# Patient Record
Sex: Male | Born: 2000 | Race: Black or African American | Hispanic: No | Marital: Single | Smoking: Never smoker
Health system: Southern US, Community
[De-identification: ages and names within clinical notes are randomized; demographics above are authoritative.]

## PROBLEM LIST (undated history)

## (undated) DIAGNOSIS — J45909 Unspecified asthma, uncomplicated: Secondary | ICD-10-CM

## (undated) DIAGNOSIS — J302 Other seasonal allergic rhinitis: Secondary | ICD-10-CM

## (undated) DIAGNOSIS — K219 Gastro-esophageal reflux disease without esophagitis: Secondary | ICD-10-CM

---
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
## 2019-02-06 ENCOUNTER — Other Ambulatory Visit: Payer: Self-pay

## 2019-02-06 DIAGNOSIS — Z20822 Contact with and (suspected) exposure to covid-19: Secondary | ICD-10-CM

## 2019-02-08 LAB — NOVEL CORONAVIRUS, NAA: SARS-CoV-2, NAA: NOT DETECTED

## 2019-03-04 ENCOUNTER — Emergency Department (HOSPITAL_COMMUNITY)
Admission: EM | Admit: 2019-03-04 | Discharge: 2019-03-04 | Disposition: A | Discharge status: Home / Self Care | Payer: 59 | Attending: Emergency Medicine | Admitting: Emergency Medicine

## 2019-03-04 ENCOUNTER — Emergency Department (HOSPITAL_COMMUNITY): Payer: 59

## 2019-03-04 ENCOUNTER — Other Ambulatory Visit: Payer: Self-pay

## 2019-03-04 ENCOUNTER — Encounter (HOSPITAL_COMMUNITY): Payer: Self-pay

## 2019-03-04 DIAGNOSIS — W25XXXA Contact with sharp glass, initial encounter: Secondary | ICD-10-CM | POA: Diagnosis not present

## 2019-03-04 DIAGNOSIS — Z23 Encounter for immunization: Secondary | ICD-10-CM | POA: Insufficient documentation

## 2019-03-04 DIAGNOSIS — Y92019 Unspecified place in single-family (private) house as the place of occurrence of the external cause: Secondary | ICD-10-CM | POA: Insufficient documentation

## 2019-03-04 DIAGNOSIS — S91312A Laceration without foreign body, left foot, initial encounter: Secondary | ICD-10-CM

## 2019-03-04 DIAGNOSIS — J45909 Unspecified asthma, uncomplicated: Secondary | ICD-10-CM | POA: Diagnosis not present

## 2019-03-04 DIAGNOSIS — Y9301 Activity, walking, marching and hiking: Secondary | ICD-10-CM | POA: Diagnosis not present

## 2019-03-04 DIAGNOSIS — Y999 Unspecified external cause status: Secondary | ICD-10-CM | POA: Insufficient documentation

## 2019-03-04 MED ORDER — LIDOCAINE-EPINEPHRINE (PF) 2 %-1:200000 IJ SOLN
10.0000 mL | Freq: Once | INTRAMUSCULAR | Status: AC
  Administered 2019-03-04: 10 mL via INTRADERMAL
  Filled 2019-03-04: qty 20

## 2019-03-04 MED ORDER — TETANUS-DIPHTH-ACELL PERTUSSIS 5-2.5-18.5 LF-MCG/0.5 IM SUSP
0.5000 mL | Freq: Once | INTRAMUSCULAR | Status: AC
  Administered 2019-03-04: 0.5 mL via INTRAMUSCULAR
  Filled 2019-03-04: qty 0.5

## 2019-03-04 NOTE — Discharge Instructions (Signed)
1. Medications: Alternate 600 mg of ibuprofen and [phone] mg of Tylenol every 3 hours as needed for pain. Do not exceed 4000 mg of Tylenol daily.  Take ibuprofen with food to avoid upset stomach issues. 2. Treatment: ice for swelling, keep wound clean with warm soap and water and keep bandage dry, do not submerge in water for 24 hours 3. Follow Up: Please return in 14 days to have your stitches/staples removed or sooner if you have concerns.  You may also go to urgent care or your primary care physician.  Return to the emergency department sooner if any concerning signs or symptoms develop such as high fevers, redness, drainage of pus from the wound, or swelling.   WOUND CARE  Keep area clean and dry for 24 hours. Do not remove bandage, if applied.  After 24 hours, remove bandage and wash wound gently with mild soap and warm water. Reapply a new bandage after cleaning wound, if directed.   Continue daily cleansing with soap and water until stitches/staples are removed.  Do not apply any ointments or creams to the wound while stitches/staples are in place, as this may cause delayed healing. Return if you experience any of the following signs of infection: Swelling, redness, pus drainage, streaking, fever >101.0 F  Return if you experience excessive bleeding that does not stop after 15-20 minutes of constant, firm pressure.

## 2019-03-04 NOTE — Progress Notes (Signed)
Orthopedic Tech Progress Note Patient Details:  Tristan Whitaker 04/25/2001 [id number]  Ortho Devices Type of Ortho Device: Postop shoe/boot Ortho Device/Splint Interventions: Adjustment, Application, Ordered   Post Interventions Patient Tolerated: Well Instructions Provided: Care of device, Adjustment of device   Melony Overly T 03/04/2019, 2:04 PM

## 2019-03-04 NOTE — ED Triage Notes (Signed)
Pt endorses stepping on broken glass causing a 2 inch laceration to left foot. Bandage in place, bleeding controlled.

## 2019-03-04 NOTE — ED Provider Notes (Signed)
[facility] EMERGENCY DEPARTMENT Provider Note   CSN: [id number] Arrival date & time: 03/04/19  1020     History   Chief Complaint Chief Complaint  Patient presents with  . Laceration    HPI Tristan Whitaker is a 18 y.o. male with history of GERD, asthma, seasonal allergies presents today for evaluation of acute onset, persistent wound to the lateral aspect of the left foot that occurred just prior to arrival.  He reports that he stepped on a piece of glass from a broken vase.  Notes some dull pain around the wound.  Bleeding controlled.  He is unsure if his tetanus is up-to-date.  Denies any numbness or tingling to the foot.  Pain worsens with attempts to ambulate or apply pressure.  He is not on any blood thinners.  He does not think there is any residual glass in the wound.     The history is provided by the patient.    Past Medical History:  Diagnosis Date  . Acid reflux   . Asthma   . Seasonal allergies     Patient Active Problem List   Diagnosis Date Noted  . Asthma exacerbation 10/15/2012  . Respiratory distress 10/15/2012    History reviewed. No pertinent surgical history.      Home Medications    Prior to Admission medications   Medication Sig Start Date End Date Taking? Authorizing Provider  albuterol (PROVENTIL) (2.5 MG/3ML) 0.083% nebulizer solution Take 2.5 mg by nebulization every 6 (six) hours as needed for wheezing.   Yes [provider]  HYDROcodone-acetaminophen (NORCO/VICODIN) 5-325 MG tablet Take 1-2 tablets by mouth every 6 (six) hours as needed. Patient not taking: Reported on 03/04/2019 06/12/17   Vicki Mallet, MD  ibuprofen (ADVIL,MOTRIN) 400 MG tablet Take 1 tablet (400 mg total) by mouth once. Patient not taking: Reported on 03/04/2019 11/23/15   Earley Favor, NP    Family History Family History  Problem Relation Age of Onset  . Hypertension Father     Social History Social History   Tobacco Use  . Smoking  status: Never Smoker  Substance Use Topics  . Alcohol use: No  . Drug use: No     Allergies   Fish allergy, Milk-related compounds, Peanuts [peanut oil], and Pork-derived products   Review of Systems Review of Systems  Skin: Positive for wound.  Neurological: Negative for weakness and numbness.     Physical Exam Updated Vital Signs BP 138/87 (BP Location: Left Arm)   Pulse 62   Temp 98.8 F (37.1 C) (Oral)   Resp 17   Ht 5\' 10"  (1.778 m)   Wt 52.2 kg   SpO2 99%   BMI 16.50 kg/m   Physical Exam Vitals signs and nursing note reviewed.  Constitutional:      General: He is not in acute distress.    Appearance: He is well-developed.  HENT:     Head: Normocephalic and atraumatic.  Eyes:     General:        Right eye: No discharge.        Left eye: No discharge.     Conjunctiva/sclera: Conjunctivae normal.  Neck:     Vascular: No JVD.     Trachea: No tracheal deviation.  Cardiovascular:     Rate and Rhythm: Normal rate.     Pulses: Normal pulses.     Comments: 2+ DP/PT pulses bilaterally, no lower extremity edema Pulmonary:     Effort: Pulmonary effort is  normal.  Abdominal:     General: There is no distension.  Musculoskeletal:     Comments: 5/5 strength of feet bilaterally with flexion and extension against resistance.  EHL strength intact.  Skin:    General: Skin is warm and dry.     Findings: No erythema.     Comments: 5 cm laceration along the lateral aspect of the left foot extending into the plantar region.  No observable foreign body.  Bleeding controlled.  Neurological:     Mental Status: He is alert.     Comments: Sensation intact to light touch of bilateral lower extremities  Psychiatric:        Behavior: Behavior normal.      ED Treatments / Results  Labs (all labs ordered are listed, but only abnormal results are displayed) Labs Reviewed - No data to display  EKG None  Radiology Dg Foot 2 Views Left  Result Date: 03/04/2019 CLINICAL  DATA:  The patient suffered a laceration of the lateral aspect of the left foot on broken glass this morning. Initial encounter. EXAM: LEFT FOOT - 2 VIEW COMPARISON:  None. FINDINGS: Skin defect along the lateral margin of the base of the fifth metatarsal consistent with laceration is identified. No radiopaque foreign body is seen. No fracture or dislocation. IMPRESSION: Laceration without underlying fracture or foreign body. Electronically Signed   By: Inge Rise M.D.   On: 03/04/2019 11:48    Procedures .Marland KitchenLaceration Repair  Date/Time: 03/04/2019 1:54 PM Performed by: Renita Papa, PA-C Authorized by: Renita Papa, PA-C   Consent:    Consent obtained:  Verbal   Consent given by:  Patient   Risks discussed:  Infection, need for additional repair, pain, poor cosmetic result and poor wound healing   Alternatives discussed:  No treatment and delayed treatment Universal protocol:    Procedure explained and questions answered to patient or proxy's satisfaction: yes     Relevant documents present and verified: yes     Test results available and properly labeled: yes     Imaging studies available: yes     Required blood products, implants, devices, and special equipment available: yes     Site/side marked: yes     Immediately prior to procedure, a time out was called: yes     Patient identity confirmed:  Verbally with patient and arm band Anesthesia (see MAR for exact dosages):    Anesthesia method:  Local infiltration   Local anesthetic:  Lidocaine 2% WITH epi Laceration details:    Location:  Foot   Foot location: lateral Left foot.   Length (cm):  5   Depth (mm):  4 Repair type:    Repair type:  Intermediate Pre-procedure details:    Preparation:  Patient was prepped and draped in usual sterile fashion and imaging obtained to evaluate for foreign bodies Exploration:    Hemostasis achieved with:  Direct pressure   Wound exploration: wound explored through full range of motion      Wound extent: areolar tissue violated     Wound extent: no foreign bodies/material noted, no nerve damage noted and no underlying fracture noted     Contaminated: no   Treatment:    Area cleansed with:  Betadine and saline   Amount of cleaning:  Extensive   Irrigation solution:  Sterile saline   Irrigation method:  Pressure wash   Visualized foreign bodies/material removed: no   Skin repair:    Repair method:  Sutures  Suture size:  4-0   Suture material:  Prolene   Suture technique:  Simple interrupted   Number of sutures:  8 Approximation:    Approximation:  Close Post-procedure details:    Dressing:  Sterile dressing   Patient tolerance of procedure:  Tolerated well, no immediate complications   (including critical care time)  Medications Ordered in ED Medications  lidocaine-EPINEPHrine (XYLOCAINE W/EPI) 2 %-1:200000 (PF) injection 10 mL (10 mLs Intradermal Given by Other 03/04/19 1342)  Tdap (BOOSTRIX) injection 0.5 mL (0.5 mLs Intramuscular Given 03/04/19 1346)     Initial Impression / Assessment and Plan / ED Course  I have reviewed the triage vital signs and the nursing notes.  Pertinent labs & imaging results that were available during my care of the patient were reviewed by me and considered in my medical decision making (see chart for details).        Patient with left foot lack sustained prior to arrival.  Patient is afebrile, vital signs are stable.  He is nontoxic in appearance.  He is neurovascularly intact.  Radiographs show no retained foreign body or osseous abnormality.  Pressure irrigation performed. Wound explored and base of wound visualized in a bloodless field without evidence of foreign body.  Laceration occurred < 8 hours prior to repair which was well tolerated. Tdap updated.  Pt has  no comorbidities to effect normal wound healing. Pt discharged without antibiotics.  Discussed suture home care with patient and answered questions. Pt to follow-up for  wound check and suture removal in 14 days; they are to return to the ED sooner for signs of infection. Pt is hemodynamically stable with no complaints prior to dc.    Final Clinical Impressions(s) / ED Diagnoses   Final diagnoses:  Laceration of left foot, initial encounter    ED Discharge Orders    None       Jeanie SewerFawze, Cortlynn Hollinsworth A, PA-C 03/04/19 1356    Tegeler, Canary Brimhristopher J, MD 03/04/19 [phone]

## 2019-04-18 ENCOUNTER — Other Ambulatory Visit: Payer: Self-pay

## 2019-04-18 DIAGNOSIS — Z20822 Contact with and (suspected) exposure to covid-19: Secondary | ICD-10-CM

## 2019-04-20 LAB — NOVEL CORONAVIRUS, NAA: SARS-CoV-2, NAA: NOT DETECTED

## 2019-07-11 IMAGING — CR DG WRIST COMPLETE 3+V*L*
3 series · 3 of 3 positions shown · non-contrast
Comparison: 12/04/2014

CLINICAL DATA: Left wrist pain and swelling after a fall playing
basketball today. Prior fractures.

EXAM:
LEFT WRIST - COMPLETE 3+ VIEW

[wrist pa]
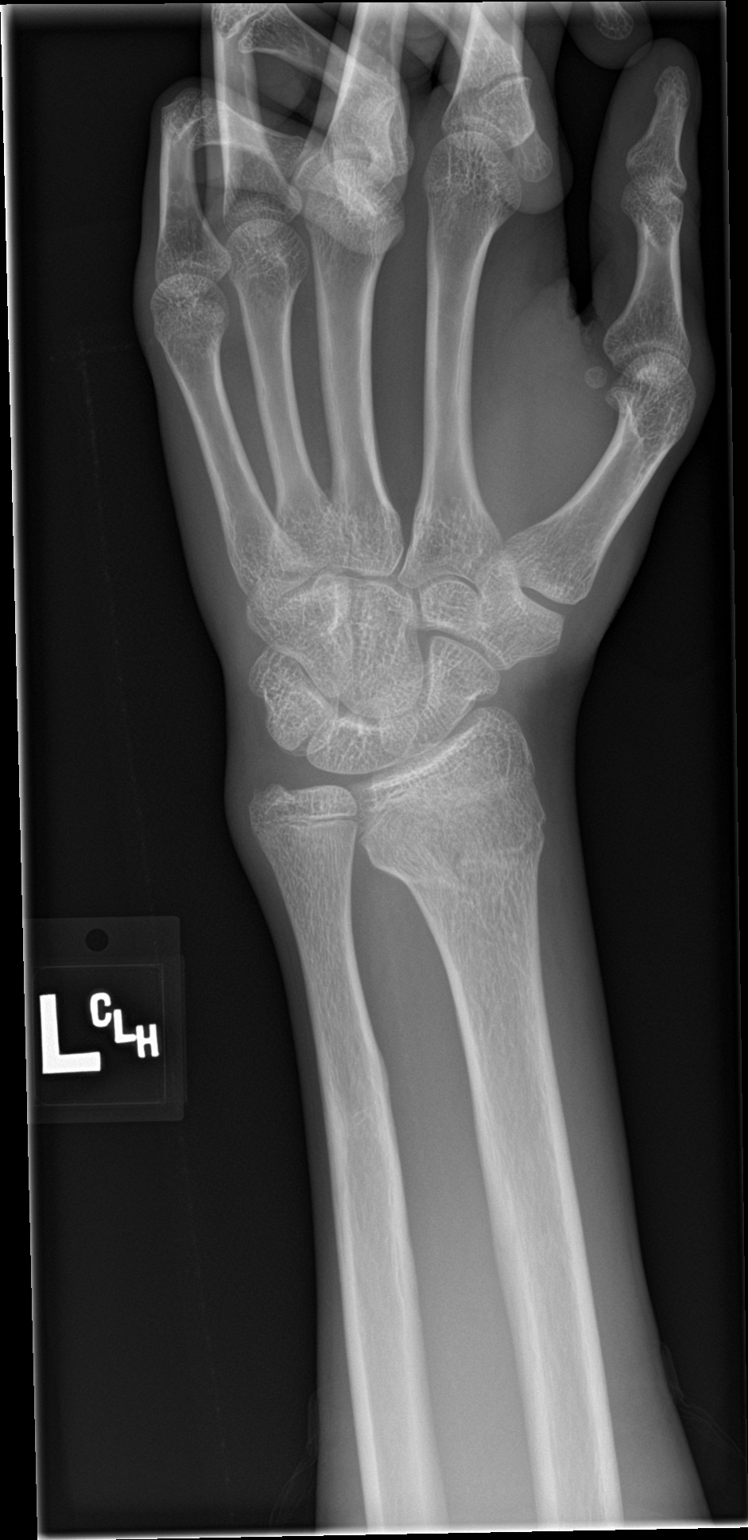

[wrist obl]
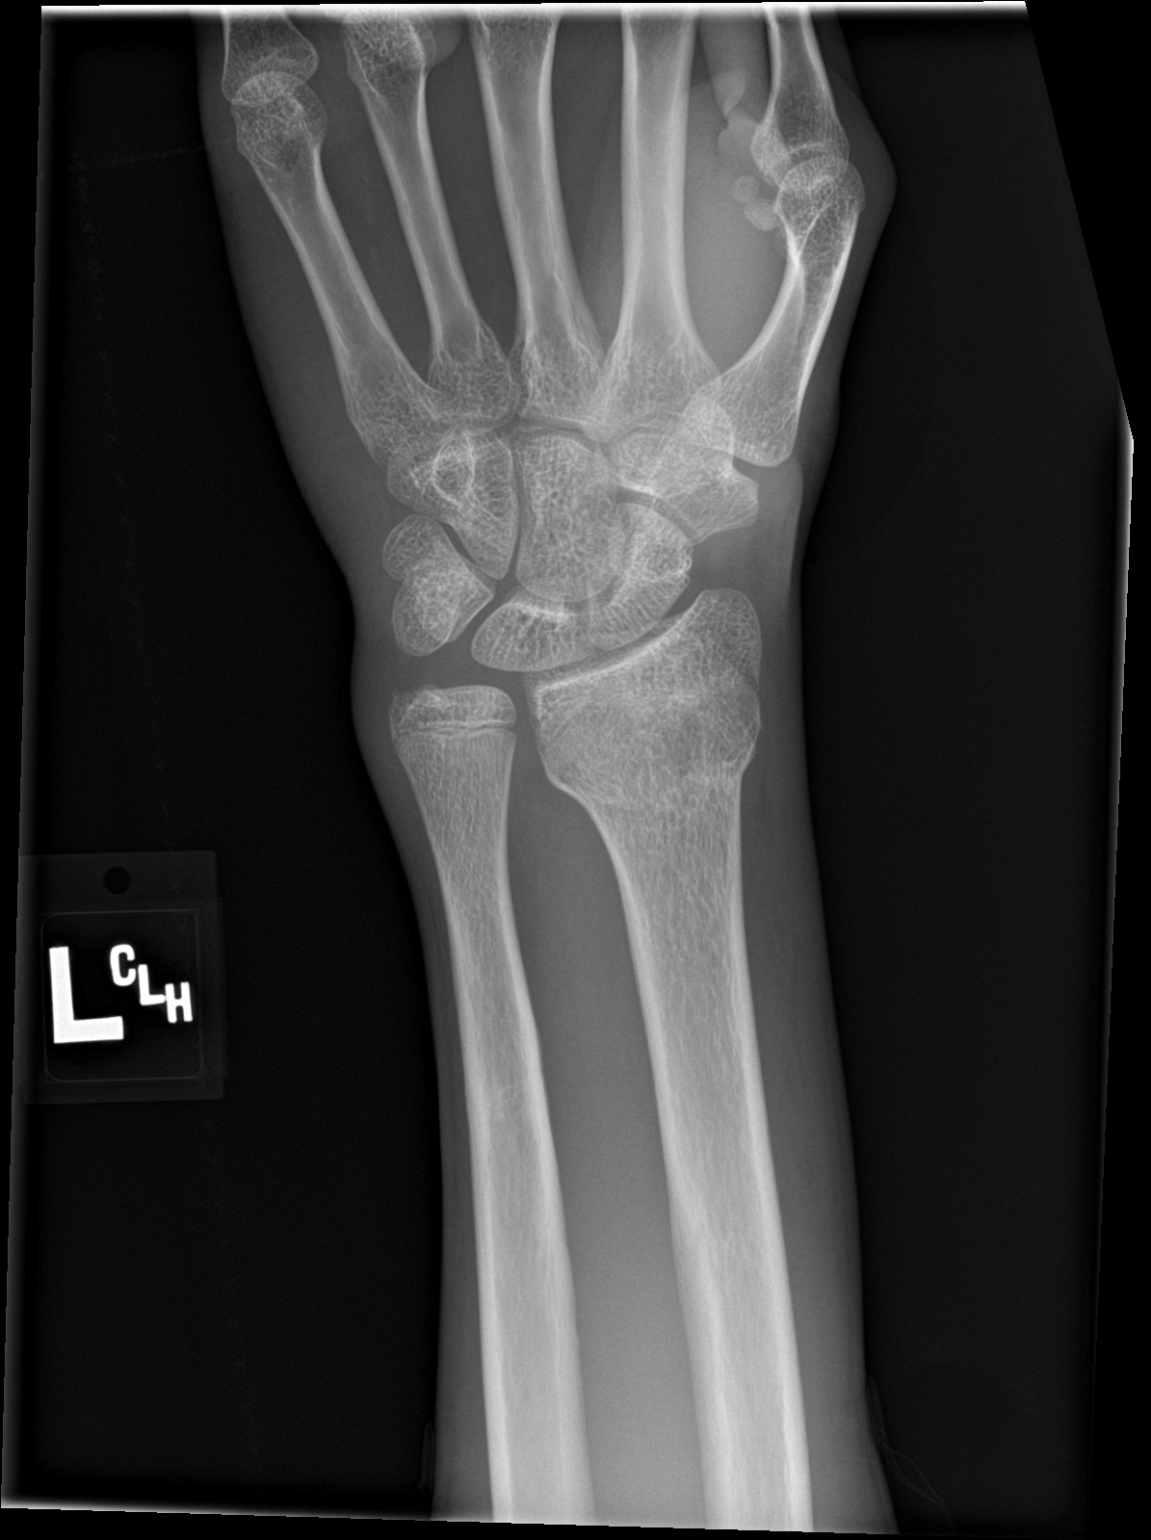

[wrist lat]
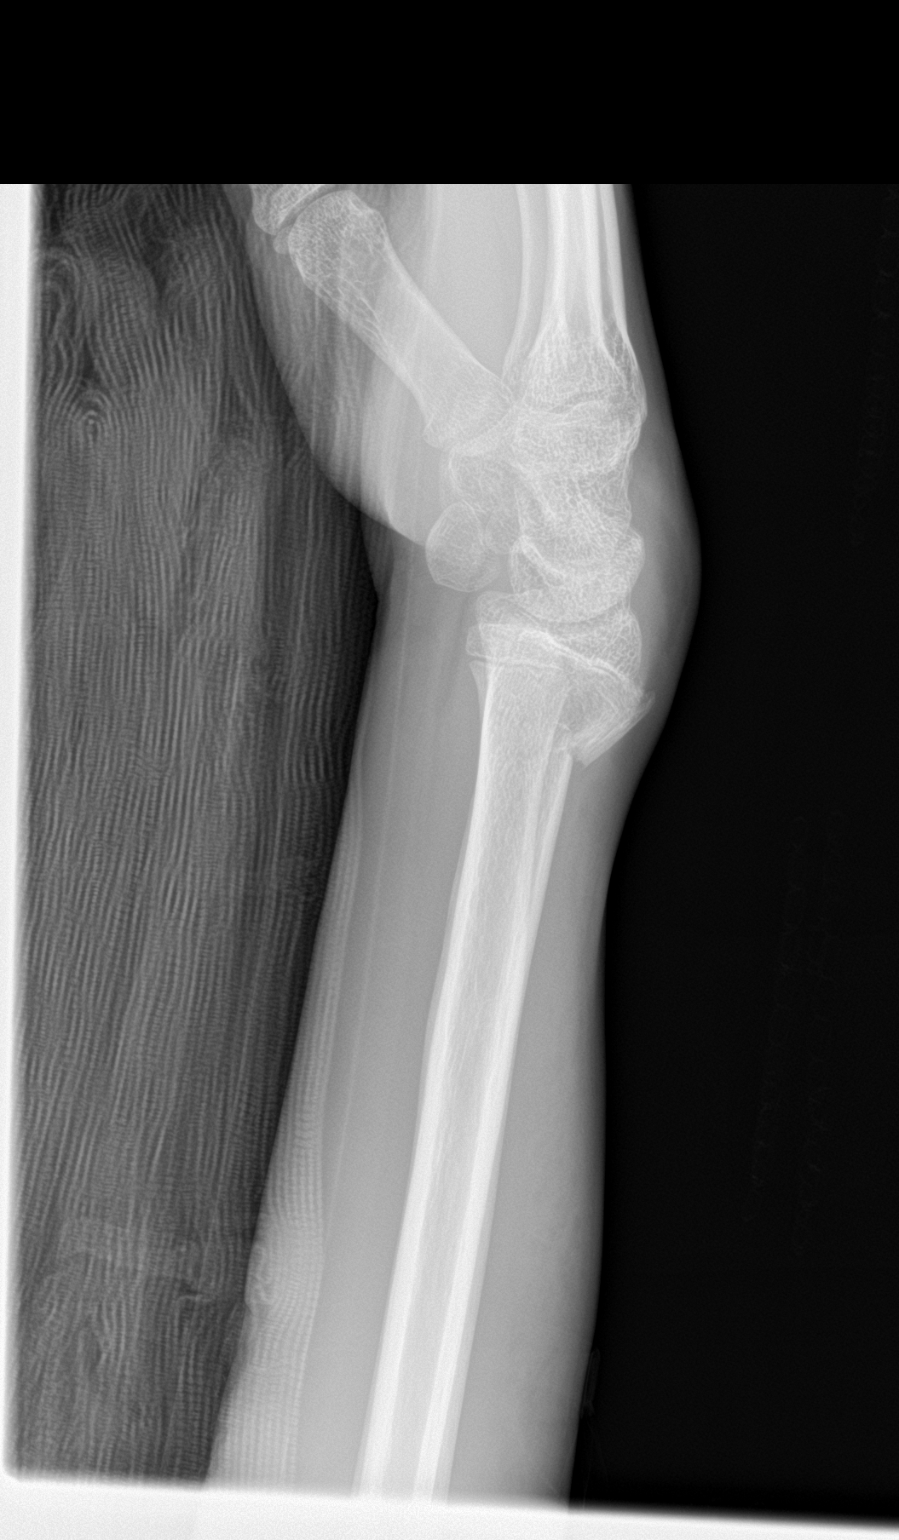

[3 of 3 positions shown; findings below may reference images not displayed]

FINDINGS: There is an acute dorsally impacted fracture through the metaphysis
of the distal left radius. There is an old avulsion of the ulnar
styloid. The avulsed fragment seen on the prior study has resorbed.
IMPRESSION: Acute dorsally impacted fracture of the metaphysis of the distal
left radius.

## 2019-07-11 IMAGING — CR DG ELBOW COMPLETE 3+V*R*
4 series · 4 of 4 positions shown · non-contrast
Comparison: None.

CLINICAL DATA: Right elbow pain after fall.

EXAM:
RIGHT ELBOW - COMPLETE 3+ VIEW

[elbow ap]
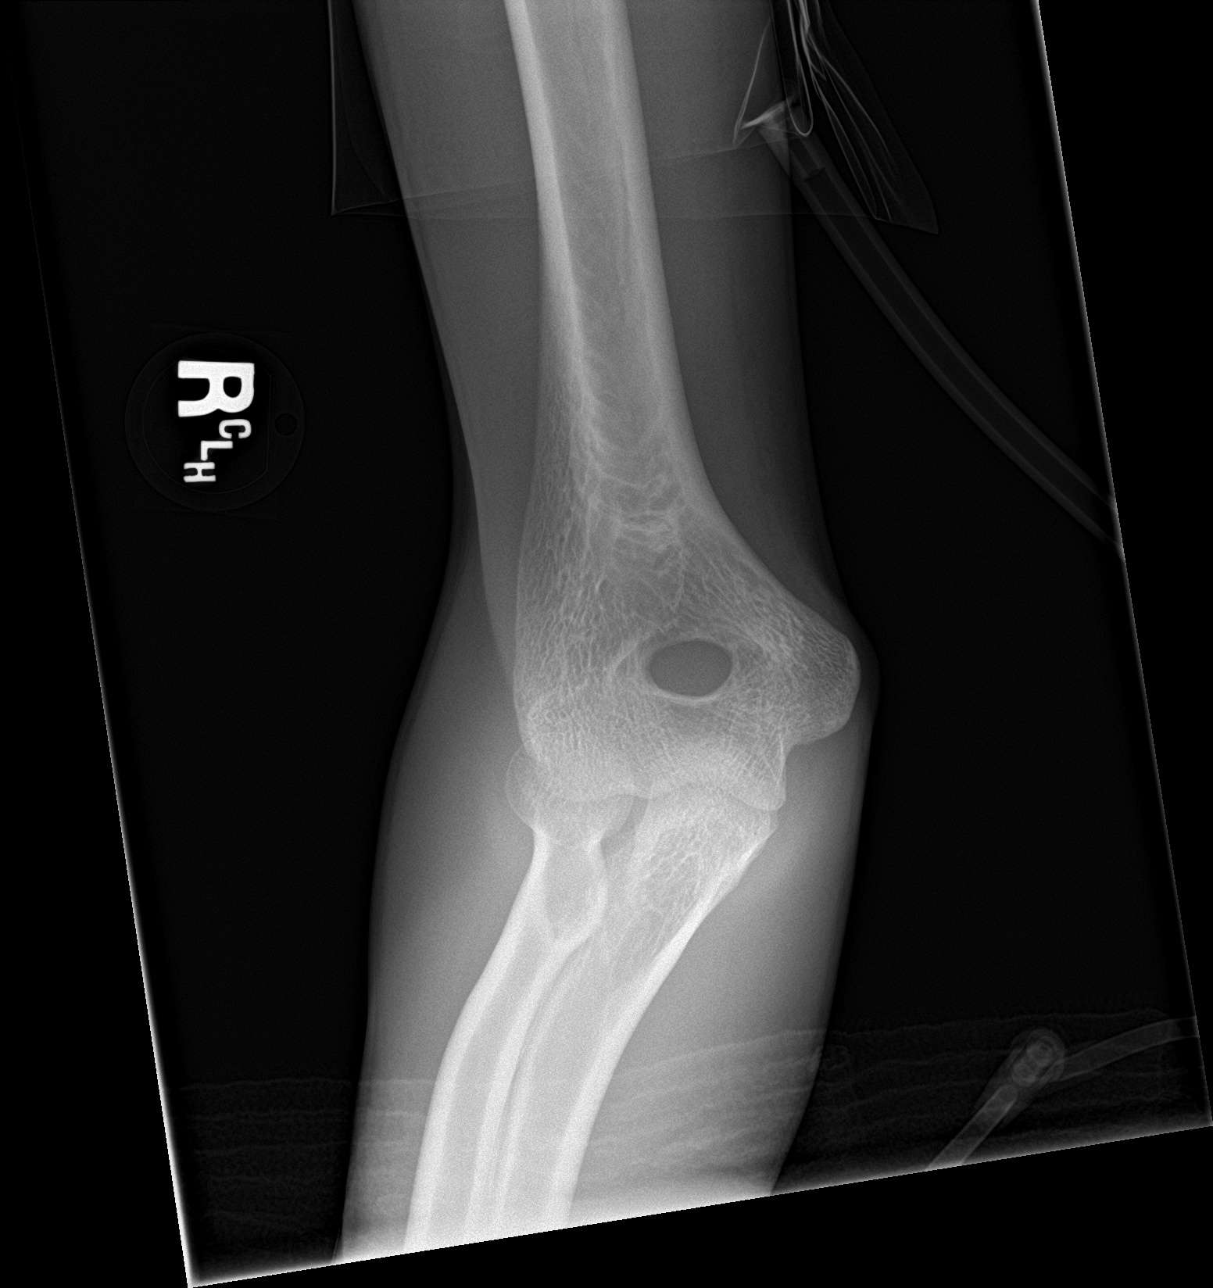

[elbow obl (1 of 2)]
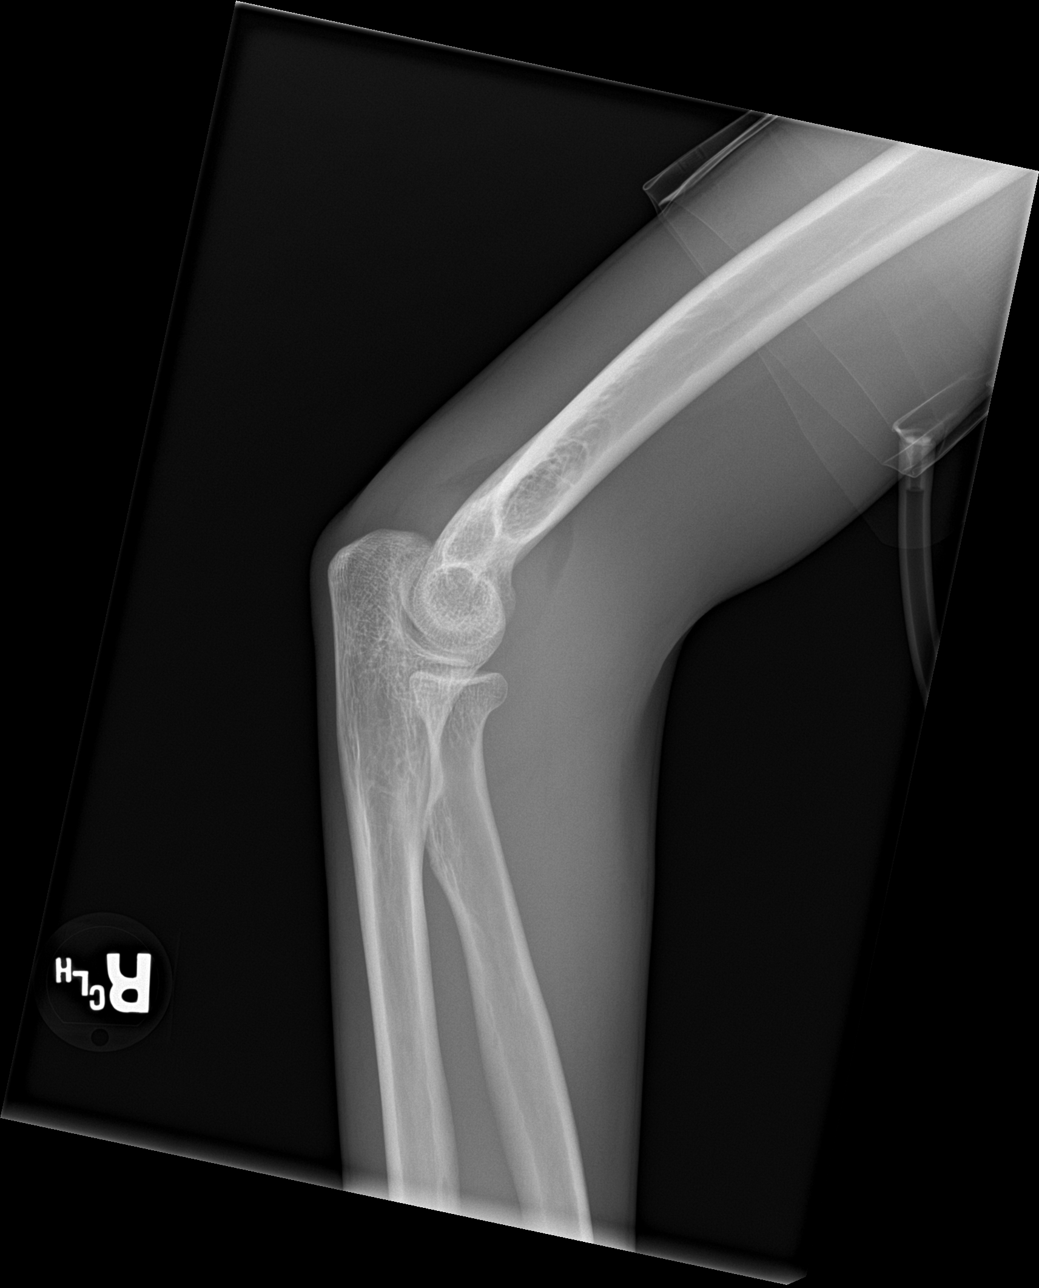

[elbow obl (2 of 2)]
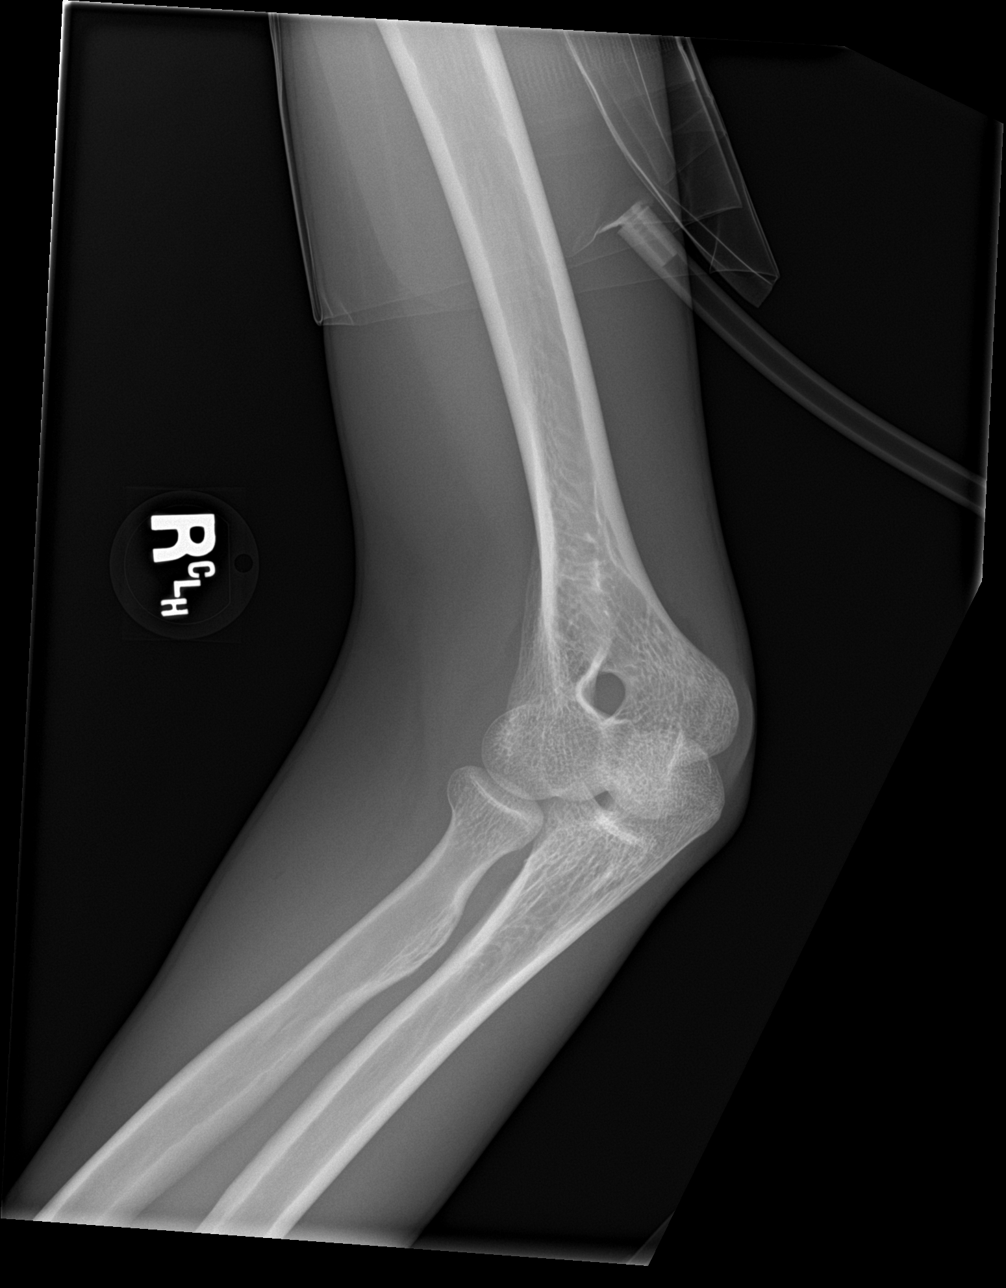

[elbow lat]
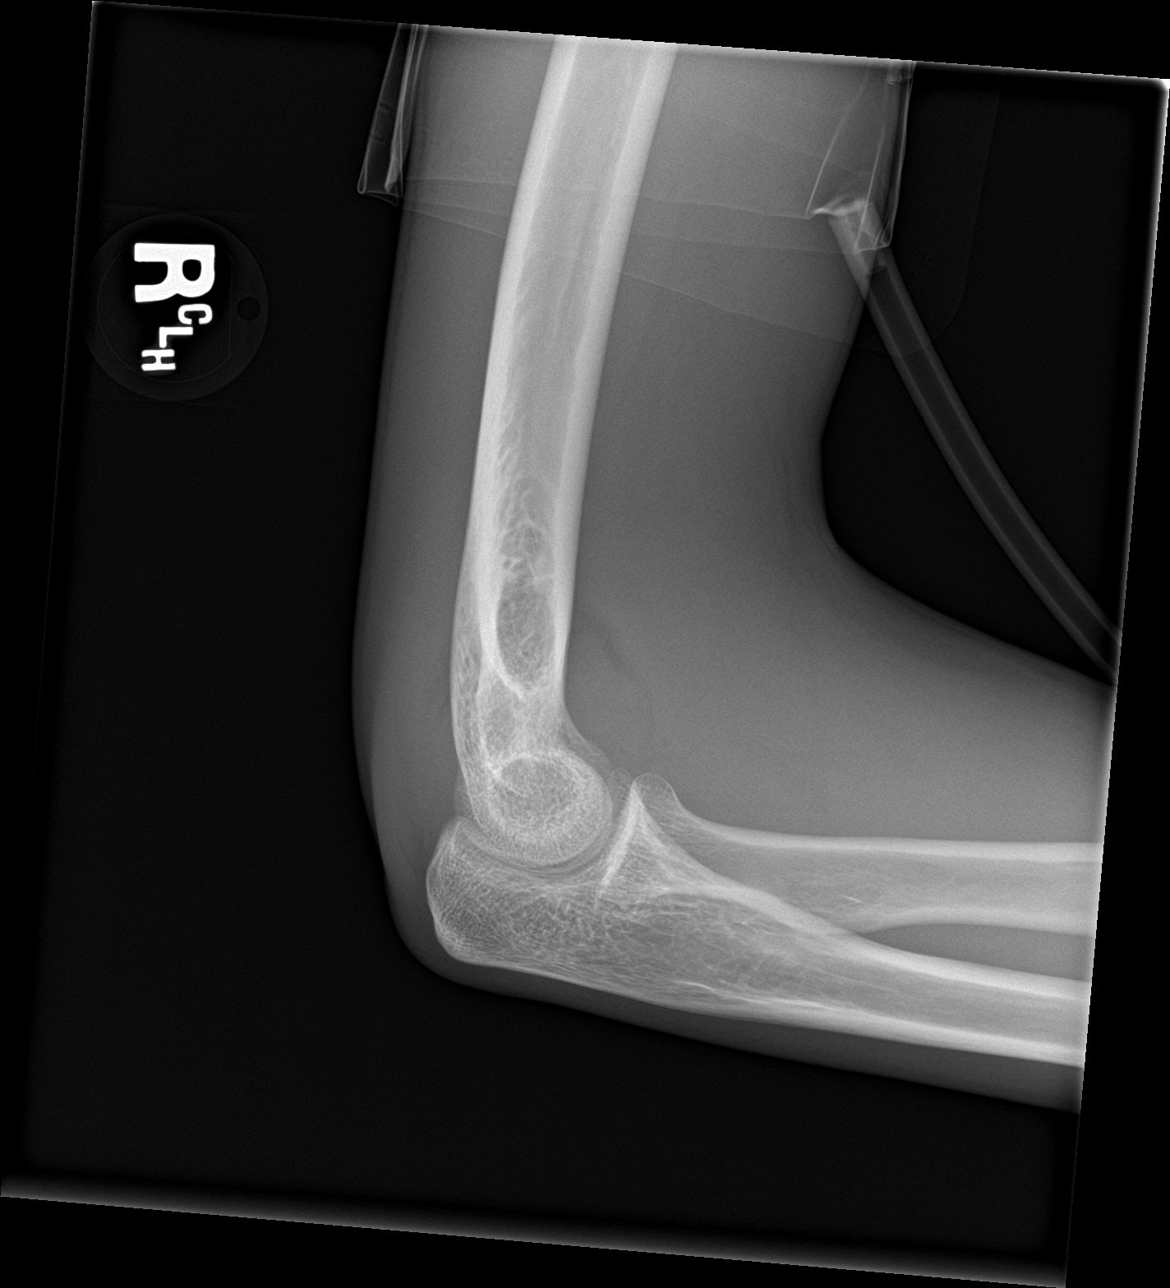

[4 of 4 positions shown; findings below may reference images not displayed]

FINDINGS: Large elbow joint effusion. No discrete fracture identified. Joint
spaces are preserved. Bone mineralization is normal. Soft tissues
are unremarkable.
IMPRESSION: Large elbow joint effusion, concerning for occult, nondisplaced
fracture.

## 2019-07-29 ENCOUNTER — Ambulatory Visit: Payer: Medicaid Other | Attending: Internal Medicine

## 2019-07-29 DIAGNOSIS — Z20822 Contact with and (suspected) exposure to covid-19: Secondary | ICD-10-CM

## 2019-07-30 LAB — NOVEL CORONAVIRUS, NAA: SARS-CoV-2, NAA: NOT DETECTED

## 2021-04-01 IMAGING — CR DG FOOT 2V*L*
2 series · 2 of 2 positions shown · non-contrast
Comparison: None.

CLINICAL DATA: The patient suffered a laceration of the lateral
aspect of the left foot on broken glass this morning. Initial
encounter.

EXAM:
LEFT FOOT - 2 VIEW

[foot ap]
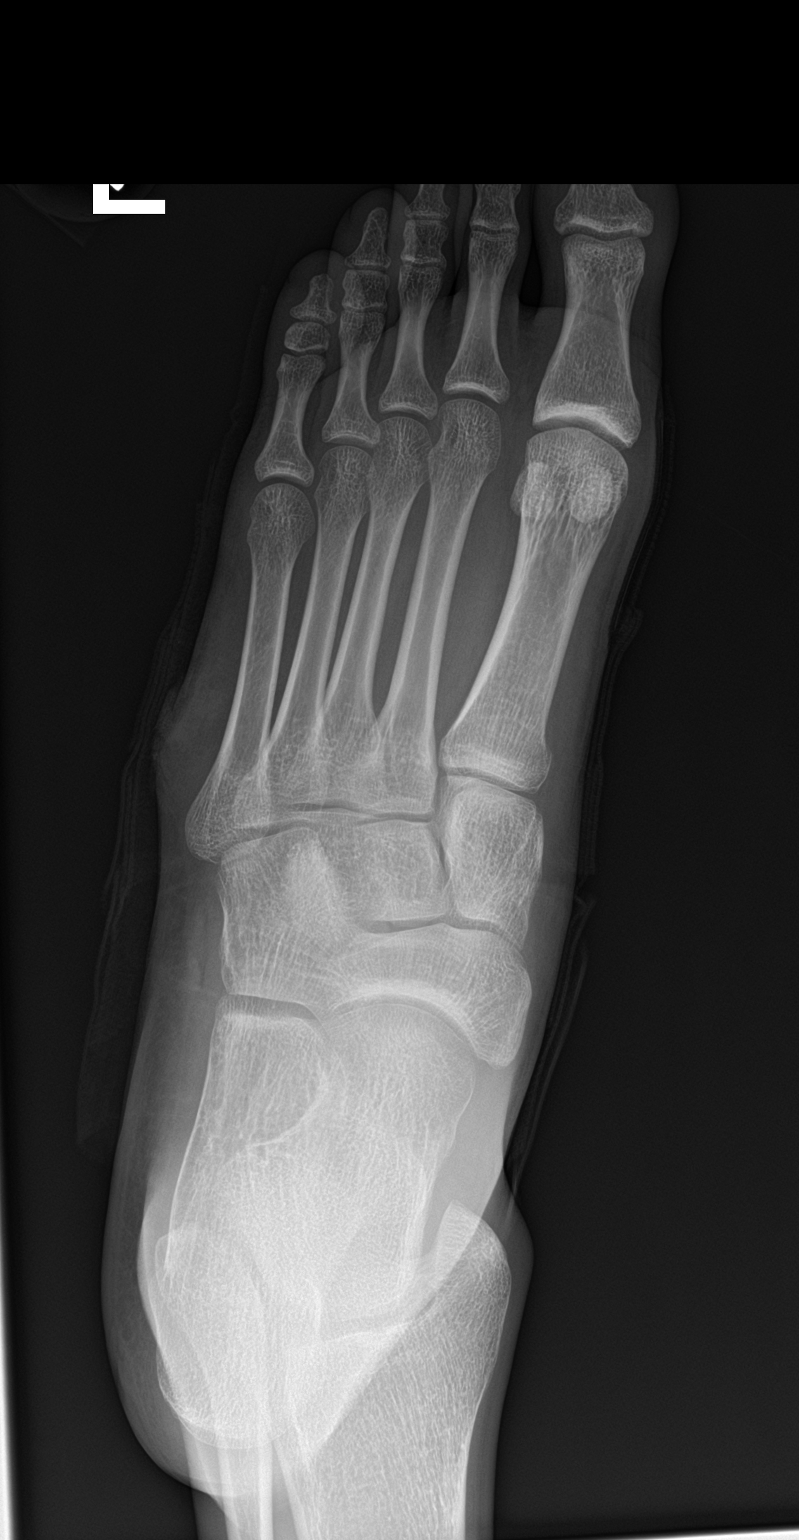

[foot lat]
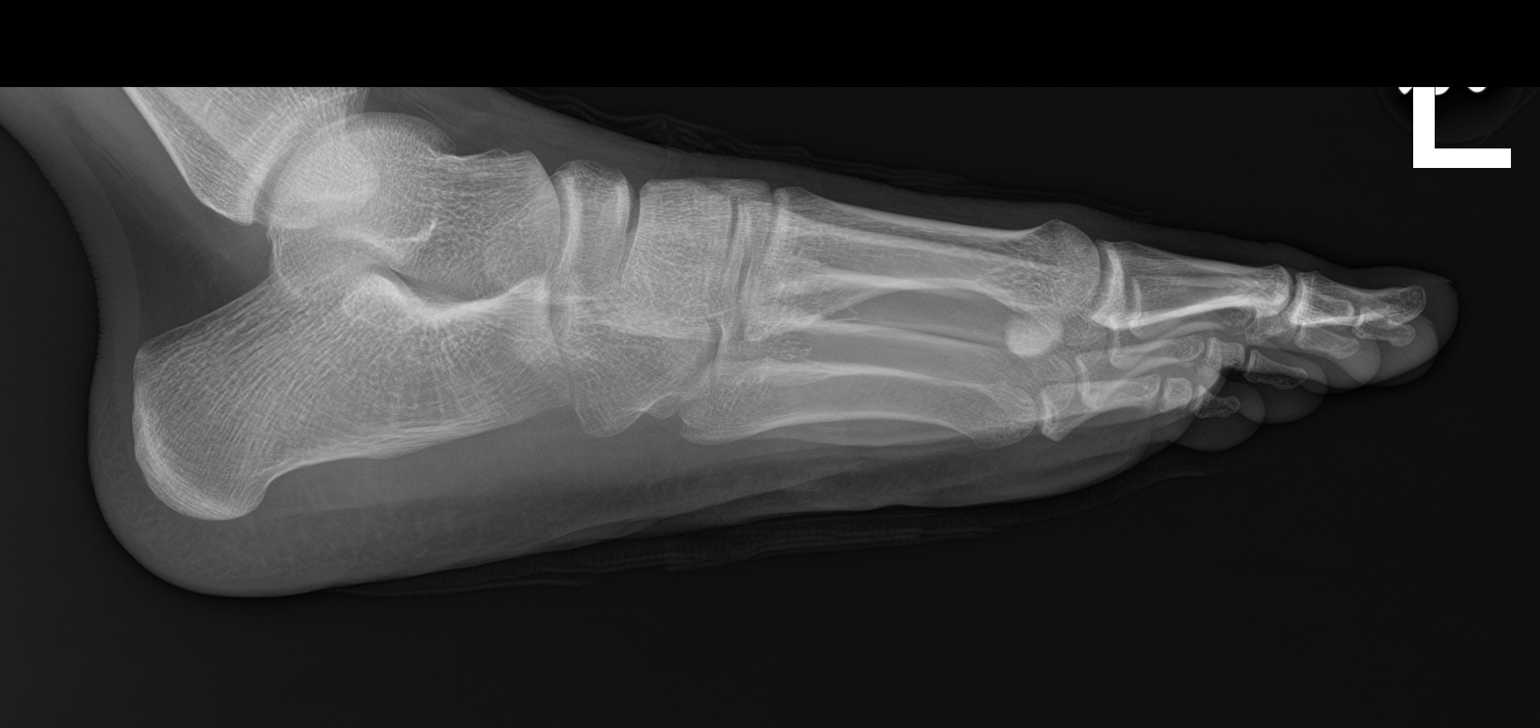

[2 of 2 positions shown; findings below may reference images not displayed]

FINDINGS: Skin defect along the lateral margin of the base of the fifth
metatarsal consistent with laceration is identified. No radiopaque
foreign body is seen. No fracture or dislocation.
IMPRESSION: Laceration without underlying fracture or foreign body.

## 2022-01-19 ENCOUNTER — Emergency Department (HOSPITAL_COMMUNITY)
Admission: EM | Admit: 2022-01-19 | Discharge: 2022-01-19 | Disposition: A | Discharge status: Home / Self Care | Payer: 59 | Attending: Emergency Medicine | Admitting: Emergency Medicine

## 2022-01-19 ENCOUNTER — Encounter (HOSPITAL_COMMUNITY): Payer: Self-pay | Admitting: Emergency Medicine

## 2022-01-19 DIAGNOSIS — R109 Unspecified abdominal pain: Secondary | ICD-10-CM

## 2022-01-19 DIAGNOSIS — Z91010 Allergy to peanuts: Secondary | ICD-10-CM | POA: Diagnosis not present

## 2022-01-19 DIAGNOSIS — R1012 Left upper quadrant pain: Secondary | ICD-10-CM | POA: Insufficient documentation

## 2022-01-19 DIAGNOSIS — R1032 Left lower quadrant pain: Secondary | ICD-10-CM | POA: Insufficient documentation

## 2022-01-19 LAB — URINALYSIS, ROUTINE W REFLEX MICROSCOPIC
Bilirubin Urine: NEGATIVE
Glucose, UA: NEGATIVE mg/dL
Hgb urine dipstick: NEGATIVE
Ketones, ur: NEGATIVE mg/dL
Leukocytes,Ua: NEGATIVE
Nitrite: NEGATIVE
Protein, ur: NEGATIVE mg/dL
Specific Gravity, Urine: 1.021 (ref 1.005–1.030)
pH: 7 (ref 5.0–8.0)

## 2022-01-19 LAB — COMPREHENSIVE METABOLIC PANEL
ALT: 22 U/L (ref 0–44)
AST: 47 U/L — ABNORMAL HIGH (ref 15–41)
Albumin: 4.2 g/dL (ref 3.5–5.0)
Alkaline Phosphatase: 68 U/L (ref 38–126)
Anion gap: 7 (ref 5–15)
BUN: 7 mg/dL (ref 6–20)
CO2: 24 mmol/L (ref 22–32)
Calcium: 9.4 mg/dL (ref 8.9–10.3)
Chloride: 108 mmol/L (ref 98–111)
Creatinine, Ser: 1.02 mg/dL (ref 0.61–1.24)
GFR, Estimated: >60 mL/min (ref >60)
Glucose, Bld: 137 mg/dL — ABNORMAL HIGH (ref 70–99)
Potassium: 4.3 mmol/L (ref 3.5–5.1)
Sodium: 139 mmol/L (ref 135–145)
Total Bilirubin: 0.9 mg/dL (ref 0.3–1.2)
Total Protein: 6.5 g/dL (ref 6.5–8.1)

## 2022-01-19 LAB — CBC
HCT: 42.9 % (ref 39.0–52.0)
Hemoglobin: 14.9 g/dL (ref 13.0–17.0)
MCH: 30.6 pg (ref 26.0–34.0)
MCHC: 34.7 g/dL (ref 30.0–36.0)
MCV: 88.1 fL (ref 80.0–100.0)
Platelets: 252 10*3/uL (ref 150–400)
RBC: 4.87 MIL/uL (ref 4.22–5.81)
RDW: 11.3 % — ABNORMAL LOW (ref 11.5–15.5)
WBC: 4.2 10*3/uL (ref 4.0–10.5)
nRBC: 0 % (ref 0.0–0.2)

## 2022-01-19 LAB — LIPASE, BLOOD: Lipase: 23 U/L (ref 11–51)

## 2022-01-19 MED ORDER — FAMOTIDINE 20 MG PO TABS: 20.0000 mg | ORAL_TABLET | Freq: Two times a day (BID) | ORAL | 0 refills | Status: AC

## 2022-01-19 MED ORDER — SUCRALFATE 1 G PO TABS: 1.0000 g | ORAL_TABLET | Freq: Three times a day (TID) | ORAL | 0 refills | Status: AC

## 2022-01-19 NOTE — Discharge Instructions (Signed)
Please read and follow all provided instructions.  Your diagnoses today include:  1. Left lateral abdominal pain     Tests performed today include: Blood cell counts and platelets Kidney and liver function tests: slightly high blood sugar today Pancreas function test (called lipase) Urine test to look for infection A blood or urine test for pregnancy (women only) Vital signs. See below for your results today.   Medications prescribed:  Pepcid (famotidine) - antihistamine  You can find this medication over-the-counter.   DO NOT exceed:  20mg  Pepcid every 12 hours  Carafate - for stomach upset and to protect your stomach  Take any prescribed medications only as directed.  Home care instructions:  Follow any educational materials contained in this packet.  Follow-up instructions: Please follow-up with your primary care provider in the next 7 days for further evaluation of your symptoms.    Return instructions:  SEEK IMMEDIATE MEDICAL ATTENTION IF: The pain does not go away or becomes severe  A temperature above 101F develops  Repeated vomiting occurs (multiple episodes)  The pain becomes localized to portions of the abdomen. The right side could possibly be appendicitis. In an adult, the left lower portion of the abdomen could be colitis or diverticulitis.  Blood is being passed in stools or vomit (bright red or black tarry stools)  You develop chest pain, difficulty breathing, dizziness or fainting, or become confused, poorly responsive, or inconsolable (young children) If you have any other emergent concerns regarding your health  Additional Information: Abdominal (belly) pain can be caused by many things. Your caregiver performed an examination and possibly ordered blood/urine tests and imaging (CT scan, x-rays, ultrasound). Many cases can be observed and treated at home after initial evaluation in the emergency department. Even though you are being discharged home,  abdominal pain can be unpredictable. Therefore, you need a repeated exam if your pain does not resolve, returns, or worsens. Most patients with abdominal pain don't have to be admitted to the hospital or have surgery, but serious problems like appendicitis and gallbladder attacks can start out as nonspecific pain. Many abdominal conditions cannot be diagnosed in one visit, so follow-up evaluations are very important.  Your vital signs today were: BP 115/70   Pulse 79   Temp 98.5 F (36.9 C) (Oral)   Resp 18   SpO2 99%  If your blood pressure (bp) was elevated above 135/85 this visit, please have this repeated by your doctor within one month. --------------

## 2022-01-19 NOTE — ED Triage Notes (Signed)
Patient complains of left sided abdominal pain that started Saturday, states pain started after drinking some water and "it felt like the water went everywhere inside". Patient denies vomiting and diarrhea, is alert, oriented, and in no apparent distress at this time.

## 2022-01-19 NOTE — ED Provider Notes (Signed)
[facility] EMERGENCY DEPARTMENT Provider Note   CSN: [id number] Arrival date & time: 01/19/22  1103     History  Chief Complaint  Patient presents with   Abdominal Pain    Tristan Whitaker is a 21 y.o. male.  Patient with no history of abdominal surgeries presents the emergency department today for evaluation of left upper abdominal pain.  Symptoms have been ongoing for the past 3 to 4 days.  Patient states that he saw his doctor and was prescribed medication for constipation.  He has been taking this and has been having normal bowel movements recently.  Symptoms have persisted however.  Unrelated to eating and drinking.  Denies heavy NSAID or alcohol use.  Does use marijuana.  No associated chest pain or shortness of breath.  No nausea, vomiting, or diarrhea.  No dysuria, hematuria, increased frequency or urgency.       Home Medications Prior to Admission medications   Medication Sig Start Date End Date Taking? Authorizing Provider  albuterol (PROVENTIL) (2.5 MG/3ML) 0.083% nebulizer solution Take 2.5 mg by nebulization every 6 (six) hours as needed for wheezing.    [provider]  HYDROcodone-acetaminophen (NORCO/VICODIN) 5-325 MG tablet Take 1-2 tablets by mouth every 6 (six) hours as needed. Patient not taking: Reported on 03/04/2019 06/12/17   Vicki Mallet, MD  ibuprofen (ADVIL,MOTRIN) 400 MG tablet Take 1 tablet (400 mg total) by mouth once. Patient not taking: Reported on 03/04/2019 11/23/15   Earley Favor, NP      Allergies    Fish allergy, Milk-related compounds, Peanuts [peanut oil], and Pork-derived products    Review of Systems   Review of Systems  Physical Exam Updated Vital Signs BP 130/86   Pulse (!) 116   Temp 97.6 F (36.4 C) (Oral)   Resp 18   SpO2 100%  Physical Exam Vitals and nursing note reviewed.  Constitutional:      General: He is not in acute distress.    Appearance: He is well-developed.     Comments: Patient in  no distress  HENT:     Head: Normocephalic and atraumatic.  Eyes:     General:        Right eye: No discharge.        Left eye: No discharge.     Conjunctiva/sclera: Conjunctivae normal.  Cardiovascular:     Rate and Rhythm: Normal rate and regular rhythm.     Heart sounds: Normal heart sounds.  Pulmonary:     Effort: Pulmonary effort is normal.     Breath sounds: Normal breath sounds.  Abdominal:     Palpations: Abdomen is soft.     Tenderness: There is abdominal tenderness in the left upper quadrant and left lower quadrant. There is no guarding or rebound. Negative signs include Murphy's sign and McBurney's sign.  Musculoskeletal:     Cervical back: Normal range of motion and neck supple.  Skin:    General: Skin is warm and dry.  Neurological:     Mental Status: He is alert.     ED Results / Procedures / Treatments   Labs (all labs ordered are listed, but only abnormal results are displayed) Labs Reviewed  COMPREHENSIVE METABOLIC PANEL - Abnormal; Notable for the following components:      Result Value   Glucose, Bld 137 (*)    AST 47 (*)    All other components within normal limits  CBC - Abnormal; Notable for the following components:   RDW  11.3 (*)    All other components within normal limits  URINALYSIS, ROUTINE W REFLEX MICROSCOPIC - Abnormal; Notable for the following components:   Color, Urine AMBER (*)    APPearance CLOUDY (*)    All other components within normal limits  LIPASE, BLOOD    EKG None  Radiology No results found.  Procedures Procedures    Medications Ordered in ED Medications - No data to display  ED Course/ Medical Decision Making/ A&P    Patient seen and examined. History obtained directly from patient. Work-up including labs, imaging, EKG ordered in triage, if performed, were reviewed.    Labs/EKG: CBC, CMP, lipase, UA pending.  Imaging: None ordered  Medications/Fluids: None ordered  Most recent vital signs reviewed and  are as follows: BP 130/86   Pulse (!) 116   Temp 97.6 F (36.4 C) (Oral)   Resp 18   SpO2 100%   Initial impression: Left-sided abdominal pain, reassuring exam  1:51 PM Reassessment performed. Patient appears comfortable.   Labs personally reviewed and interpreted including: CBC unremarkable; CMP minimally elevated blood sugar otherwise unremarkable; lipase normal.  UA cloudy without other compelling signs of infection.  Imaging personally visualized and interpreted including: None ordered  Reviewed pertinent lab work and imaging with patient at bedside. Questions answered.   Most current vital signs reviewed and are as follows: BP 115/70   Pulse 79   Temp 98.5 F (36.9 C) (Oral)   Resp 18   SpO2 99%   Plan: Discharged home with prescription for Pepcid, Carafate.  Encouraged bland diet.  ED return instructions discussed: The patient was urged to return to the Emergency Department immediately with worsening of current symptoms, worsening abdominal pain, persistent vomiting, blood noted in stools, fever, or any other concerns. The patient verbalized understanding.   Follow-up instructions discussed: Patient encouraged to follow-up with their PCP in 7 days.                            Medical Decision Making Amount and/or Complexity of Data Reviewed Labs: ordered.   For this patient's complaint of abdominal pain, the following conditions were considered on the differential diagnosis: gastritis/PUD, enteritis/duodenitis, appendicitis, cholelithiasis/cholecystitis, cholangitis, pancreatitis, ruptured viscus, colitis, diverticulitis, small/large bowel obstruction, proctitis, cystitis, pyelonephritis, ureteral colic, aortic dissection, aortic aneurysm. Atypical chest etiologies were also considered including ACS, PE, and pneumonia.   The patient's vital signs, pertinent lab work and imaging were reviewed and interpreted as discussed in the ED course. Hospitalization was considered  for further testing, treatments, or serial exams/observation. However as patient is well-appearing, has a stable exam, and reassuring studies today, I do not feel that they warrant admission at this time. This plan was discussed with the patient who verbalizes agreement and comfort with this plan and seems reliable and able to return to the Emergency Department with worsening or changing symptoms.          Final Clinical Impression(s) / ED Diagnoses Final diagnoses:  Left lateral abdominal pain    Rx / DC Orders ED Discharge Orders          Ordered    famotidine (PEPCID) 20 MG tablet  2 times daily        01/19/22 1349    sucralfate (CARAFATE) 1 g tablet  3 times daily with meals & bedtime        01/19/22 1349  Renne Crigler, PA-C 01/19/22 1353    Gloris Manchester, MD 01/24/22 [phone]

## 2022-01-19 NOTE — ED Provider Triage Note (Signed)
Emergency Medicine Provider Triage Evaluation Note  Tristan Whitaker , a 21 y.o. male  was evaluated in triage.  Pt complains of abdominal pain. States that same began on Saturday and has been persistent since. It is located in the LLQ and does not radiate. Denies any n/v/d, is having normal bowel movements. No hematuria or dysuria. No hx of abdominal surgeries.  Review of Systems  Positive:  Negative:   Physical Exam  BP 130/86   Pulse (!) 116   Temp 97.6 F (36.4 C) (Oral)   Resp 18   SpO2 100%  Gen:   Awake, no distress   Resp:  Normal effort  MSK:   Moves extremities without difficulty  Other:    Medical Decision Making  Medically screening exam initiated at 12:08 PM.  Appropriate orders placed.  Tristan Whitaker was informed that the remainder of the evaluation will be completed by another provider, this initial triage assessment does not replace that evaluation, and the importance of remaining in the ED until their evaluation is complete.     Silva Bandy, PA-C 01/19/22 1210

## 2023-11-04 ENCOUNTER — Emergency Department (HOSPITAL_COMMUNITY)
Admission: EM | Admit: 2023-11-04 | Discharge: 2023-11-05 | Disposition: A | Discharge status: Home / Self Care | Attending: Emergency Medicine | Admitting: Emergency Medicine

## 2023-11-04 ENCOUNTER — Other Ambulatory Visit: Payer: Self-pay

## 2023-11-04 DIAGNOSIS — Z91010 Allergy to peanuts: Secondary | ICD-10-CM | POA: Insufficient documentation

## 2023-11-04 DIAGNOSIS — J45909 Unspecified asthma, uncomplicated: Secondary | ICD-10-CM | POA: Diagnosis not present

## 2023-11-04 DIAGNOSIS — M62830 Muscle spasm of back: Secondary | ICD-10-CM | POA: Diagnosis not present

## 2023-11-04 DIAGNOSIS — M545 Low back pain, unspecified: Secondary | ICD-10-CM | POA: Diagnosis present

## 2023-11-04 LAB — CBC WITH DIFFERENTIAL/PLATELET
Abs Immature Granulocytes: 0.01 10*3/uL (ref 0.00–0.07)
Basophils Absolute: 0 10*3/uL (ref 0.0–0.1)
Basophils Relative: 1 %
Eosinophils Absolute: 0.2 10*3/uL (ref 0.0–0.5)
Eosinophils Relative: 2 %
HCT: 45.7 % (ref 39.0–52.0)
Hemoglobin: 15.6 g/dL (ref 13.0–17.0)
Immature Granulocytes: 0 %
Lymphocytes Relative: 27 %
Lymphs Abs: 1.7 10*3/uL (ref 0.7–4.0)
MCH: 29.9 pg (ref 26.0–34.0)
MCHC: 34.1 g/dL (ref 30.0–36.0)
MCV: 87.5 fL (ref 80.0–100.0)
Monocytes Absolute: 0.5 10*3/uL (ref 0.1–1.0)
Monocytes Relative: 8 %
Neutro Abs: 3.9 10*3/uL (ref 1.7–7.7)
Neutrophils Relative %: 62 %
Platelets: 265 10*3/uL (ref 150–400)
RBC: 5.22 MIL/uL (ref 4.22–5.81)
RDW: 11.9 % (ref 11.5–15.5)
WBC: 6.3 10*3/uL (ref 4.0–10.5)
nRBC: 0 % (ref 0.0–0.2)

## 2023-11-04 LAB — COMPREHENSIVE METABOLIC PANEL WITH GFR
ALT: 26 U/L (ref 0–44)
AST: 28 U/L (ref 15–41)
Albumin: 4.5 g/dL (ref 3.5–5.0)
Alkaline Phosphatase: 70 U/L (ref 38–126)
Anion gap: 7 (ref 5–15)
BUN: 10 mg/dL (ref 6–20)
CO2: 27 mmol/L (ref 22–32)
Calcium: 9.5 mg/dL (ref 8.9–10.3)
Chloride: 103 mmol/L (ref 98–111)
Creatinine, Ser: 0.81 mg/dL (ref 0.61–1.24)
GFR, Estimated: >60 mL/min (ref >60)
Glucose, Bld: 99 mg/dL (ref 70–99)
Potassium: 3.9 mmol/L (ref 3.5–5.1)
Sodium: 137 mmol/L (ref 135–145)
Total Bilirubin: 0.8 mg/dL (ref 0.0–1.2)
Total Protein: 7.7 g/dL (ref 6.5–8.1)

## 2023-11-04 LAB — TROPONIN I (HIGH SENSITIVITY): Troponin I (High Sensitivity): 3 ng/L (ref <18)

## 2023-11-04 LAB — LIPASE, BLOOD: Lipase: 25 U/L (ref 11–51)

## 2023-11-04 NOTE — ED Triage Notes (Signed)
 POV, Pt had pain on his back on and off, but today start have some sharp irradiation to the from of the abdomen lower chest. That comes and goes.  Alert at triage pain in the back 6 out of 10, and 8 out 10 on chest.   Back pain worses with bowel movements. Denies constipation

## 2023-11-04 NOTE — ED Provider Notes (Signed)
  [place] EMERGENCY DEPARTMENT AT [facility] Provider Note   CSN: [id number] Arrival date & time: 11/04/23  2056     History {Add pertinent medical, surgical, social history, OB history to HPI:1} Chief Complaint  Patient presents with   Flank Pain    Left    Truth Bunkley is a 23 y.o. male.   Flank Pain  Patient presents for***.  Medical history includes asthma.***     Home Medications Prior to Admission medications   Medication Sig Start Date End Date Taking? Authorizing Provider  albuterol  (PROVENTIL ) (2.5 MG/3ML) 0.083% nebulizer solution Take 2.5 mg by nebulization every 6 (six) hours as needed for wheezing.    [provider]  famotidine  (PEPCID ) 20 MG tablet Take 1 tablet (20 mg total) by mouth 2 (two) times daily. 01/19/22   Geiple, Joshua, PA-C  sucralfate  (CARAFATE ) 1 g tablet Take 1 tablet (1 g total) by mouth 4 (four) times daily -  with meals and at bedtime. 01/19/22   Geiple, Joshua, PA-C      Allergies    Fish allergy, Milk-related compounds, Peanuts [peanut oil], and Pork-derived products    Review of Systems   Review of Systems  Genitourinary:  Positive for flank pain.    Physical Exam Updated Vital Signs BP 134/80   Pulse 84   Temp 97.7 F (36.5 C) (Oral)   Resp 16   SpO2 94%  Physical Exam  ED Results / Procedures / Treatments   Labs (all labs ordered are listed, but only abnormal results are displayed) Labs Reviewed  COMPREHENSIVE METABOLIC PANEL WITH GFR  LIPASE, BLOOD  CBC WITH DIFFERENTIAL/PLATELET  URINALYSIS, ROUTINE W REFLEX MICROSCOPIC  TROPONIN I (HIGH SENSITIVITY)  TROPONIN I (HIGH SENSITIVITY)    EKG None  Radiology No results found.  Procedures Procedures  {Document cardiac monitor, telemetry assessment procedure when appropriate:1}  Medications Ordered in ED Medications - No data to display  ED Course/ Medical Decision Making/ A&P   {   Click here for ABCD2, HEART and other  calculatorsREFRESH Note before signing :1}                              Medical Decision Making Amount and/or Complexity of Data Reviewed Labs: ordered.   ***  {Document critical care time when appropriate:1} {Document review of labs and clinical decision tools ie heart score, Chads2Vasc2 etc:1}  {Document your independent review of radiology images, and any outside records:1} {Document your discussion with family members, caretakers, and with consultants:1} {Document social determinants of health affecting pt's care:1} {Document your decision making why or why not admission, treatments were needed:1} Final Clinical Impression(s) / ED Diagnoses Final diagnoses:  None    Rx / DC Orders ED Discharge Orders     None

## 2023-11-05 ENCOUNTER — Emergency Department (HOSPITAL_COMMUNITY)

## 2023-11-05 DIAGNOSIS — M62830 Muscle spasm of back: Secondary | ICD-10-CM | POA: Diagnosis not present

## 2023-11-05 LAB — URINALYSIS, ROUTINE W REFLEX MICROSCOPIC
Bilirubin Urine: NEGATIVE
Glucose, UA: NEGATIVE mg/dL
Hgb urine dipstick: NEGATIVE
Ketones, ur: NEGATIVE mg/dL
Leukocytes,Ua: NEGATIVE
Nitrite: NEGATIVE
Protein, ur: NEGATIVE mg/dL
Specific Gravity, Urine: 1.016 (ref 1.005–1.030)
pH: 6 (ref 5.0–8.0)

## 2023-11-05 LAB — MAGNESIUM: Magnesium: 1.8 mg/dL (ref 1.7–2.4)

## 2023-11-05 LAB — TROPONIN I (HIGH SENSITIVITY): Troponin I (High Sensitivity): 3 ng/L (ref <18)

## 2023-11-05 MED ORDER — LIDOCAINE 5 % EX PTCH
1.0000 | MEDICATED_PATCH | CUTANEOUS | Status: DC
  Administered 2023-11-05: 1 via TRANSDERMAL
  Filled 2023-11-05: qty 1

## 2023-11-05 MED ORDER — LACTATED RINGERS IV BOLUS
1000.0000 mL | Freq: Once | INTRAVENOUS | Status: AC
  Administered 2023-11-05: 1000 mL via INTRAVENOUS

## 2023-11-05 MED ORDER — METHOCARBAMOL 500 MG PO TABS: 500.0000 mg | ORAL_TABLET | Freq: Four times a day (QID) | ORAL | 0 refills | Status: AC | PRN

## 2023-11-05 MED ORDER — METHOCARBAMOL 500 MG PO TABS
1000.0000 mg | ORAL_TABLET | Freq: Once | ORAL | Status: AC
  Administered 2023-11-05: 1000 mg via ORAL
  Filled 2023-11-05: qty 2

## 2023-11-05 MED ORDER — KETOROLAC TROMETHAMINE 15 MG/ML IJ SOLN
15.0000 mg | Freq: Once | INTRAMUSCULAR | Status: AC
Start: 2023-11-05 — End: 2023-11-10
  Administered 2023-11-05: 15 mg via INTRAVENOUS
  Filled 2023-11-05: qty 1

## 2023-11-05 NOTE — Discharge Instructions (Signed)
 Your test results today are reassuring.  Take over-the-counter ibuprofen  and Tylenol  as needed for pain.  A prescription for a muscle relaxer was sent to your pharmacy as well.  Take this as needed.  Drink plenty fluids to stay hydrated.  Movement and stretching is good for this type of pain.  Pain should resolve with time.  Return to the emergency department for any new or worsening symptoms of concern.

## 2023-11-05 NOTE — ED Provider Notes (Incomplete)
 [place] EMERGENCY DEPARTMENT AT [facility] Provider Note   CSN: [id number] Arrival date & time: 11/04/23  2056     History {Add pertinent medical, surgical, social history, OB history to HPI:1} Chief Complaint  Patient presents with  . Flank Pain    Left    Tristan Whitaker is a 23 y.o. male.   Flank Pain Associated symptoms include chest pain.  Patient presents for pain in left lower back and left ribs.  Medical history includes asthma. Yesterday, he was in his normal state of health.  This morning, he woke up with a left lower back pain is worsened with movements.  While at work, he had radiations of pain into left lower lateral ribs.  This pain is also worsened with movements.  He denies any recent injuries or straining.  He has not taken anything for pain.     Home Medications Prior to Admission medications   Medication Sig Start Date End Date Taking? Authorizing Provider  albuterol  (PROVENTIL ) (2.5 MG/3ML) 0.083% nebulizer solution Take 2.5 mg by nebulization every 6 (six) hours as needed for wheezing.    [provider]  famotidine  (PEPCID ) 20 MG tablet Take 1 tablet (20 mg total) by mouth 2 (two) times daily. 01/19/22   Geiple, Joshua, PA-C  sucralfate  (CARAFATE ) 1 g tablet Take 1 tablet (1 g total) by mouth 4 (four) times daily -  with meals and at bedtime. 01/19/22   Geiple, Joshua, PA-C      Allergies    Fish allergy, Milk-related compounds, Peanuts [peanut oil], and Pork-derived products    Review of Systems   Review of Systems  Cardiovascular:  Positive for chest pain.  Genitourinary:  Positive for flank pain.  Musculoskeletal:  Positive for back pain.  All other systems reviewed and are negative.   Physical Exam Updated Vital Signs BP 134/80   Pulse 84   Temp 97.7 F (36.5 C) (Oral)   Resp 16   SpO2 94%  Physical Exam Vitals and nursing note reviewed.  Constitutional:      General: He is not in acute distress.    Appearance: Normal  appearance. He is well-developed. He is not ill-appearing, toxic-appearing or diaphoretic.  HENT:     Head: Normocephalic and atraumatic.     Right Ear: External ear normal.     Left Ear: External ear normal.     Nose: Nose normal.     Mouth/Throat:     Mouth: Mucous membranes are moist.  Eyes:     Extraocular Movements: Extraocular movements intact.     Conjunctiva/sclera: Conjunctivae normal.  Cardiovascular:     Rate and Rhythm: Normal rate and regular rhythm.  Pulmonary:     Effort: Pulmonary effort is normal. No respiratory distress.  Abdominal:     General: There is no distension.     Palpations: Abdomen is soft.     Tenderness: There is no abdominal tenderness.  Musculoskeletal:        General: No swelling, deformity or signs of injury.     Cervical back: Normal range of motion and neck supple.  Skin:    General: Skin is warm and dry.     Coloration: Skin is not jaundiced or pale.  Neurological:     General: No focal deficit present.     Mental Status: He is alert and oriented to person, place, and time.  Psychiatric:        Mood and Affect: Mood normal.  Behavior: Behavior normal.     ED Results / Procedures / Treatments   Labs (all labs ordered are listed, but only abnormal results are displayed) Labs Reviewed  COMPREHENSIVE METABOLIC PANEL WITH GFR  LIPASE, BLOOD  CBC WITH DIFFERENTIAL/PLATELET  URINALYSIS, ROUTINE W REFLEX MICROSCOPIC  TROPONIN I (HIGH SENSITIVITY)  TROPONIN I (HIGH SENSITIVITY)    EKG None  Radiology No results found.  Procedures Procedures  {Document cardiac monitor, telemetry assessment procedure when appropriate:1}  Medications Ordered in ED Medications - No data to display  ED Course/ Medical Decision Making/ A&P   {   Click here for ABCD2, HEART and other calculatorsREFRESH Note before signing :1}                              Medical Decision Making Amount and/or Complexity of Data Reviewed Labs:  ordered.   Patient presenting for pain in left lower back, radiating to left lower lateral ribs.  Onset of symptoms was today.  Pain is worsened with movements.  Pain will occur in spasms.  I suspect musculoskeletal etiology.  Will get lab work to assess electrolytes.  IV fluids and pain medication was ordered.***.  {Document critical care time when appropriate:1} {Document review of labs and clinical decision tools ie heart score, Chads2Vasc2 etc:1}  {Document your independent review of radiology images, and any outside records:1} {Document your discussion with family members, caretakers, and with consultants:1} {Document social determinants of health affecting pt's care:1} {Document your decision making why or why not admission, treatments were needed:1} Final Clinical Impression(s) / ED Diagnoses Final diagnoses:  None    Rx / DC Orders ED Discharge Orders     None
